# Patient Record
Sex: Male | Born: 1960 | Hispanic: Yes | Marital: Married | Smoking: Current every day smoker
Health system: Southern US, Community
[De-identification: ages and names within clinical notes are randomized; demographics above are authoritative.]

## PROBLEM LIST (undated history)

## (undated) DIAGNOSIS — T7840XA Allergy, unspecified, initial encounter: Secondary | ICD-10-CM

## (undated) DIAGNOSIS — K219 Gastro-esophageal reflux disease without esophagitis: Secondary | ICD-10-CM

## (undated) DIAGNOSIS — E785 Hyperlipidemia, unspecified: Secondary | ICD-10-CM

## (undated) HISTORY — DX: Gastro-esophageal reflux disease without esophagitis: K21.9

## (undated) HISTORY — DX: Allergy, unspecified, initial encounter: T78.40XA

## (undated) HISTORY — PX: POLYPECTOMY: SHX149

## (undated) HISTORY — PX: CHOLECYSTECTOMY: SHX55

## (undated) HISTORY — DX: Hyperlipidemia, unspecified: E78.5

---

## 2006-05-24 ENCOUNTER — Encounter: Admission: RE | Admit: 2006-05-24 | Discharge: 2006-05-24 | Payer: Self-pay | Admitting: Cardiology

## 2007-04-30 IMAGING — CT CT CHEST W/ CM
2 of 4 series · 15 of 36 positions shown, 18 images · IV contrast (75cc omnipaque)
Comparison: none

CLINICAL DATA: Pulmonary nodule noted during cardiac catheterization. 
 CT CHEST WITH CONTRAST:
TECHNIQUE: Multidetector CT imaging of the chest was performed following the standard protocol during bolus administration of intravenous contrast.
 Contrast:  75 cc Omnipaque 300

[Series 2: — · axial · 0.70mm/px · z∈[-249,+1]mm · 12 of 60 slices shown, 15 images]
[im 5/60  mediastinal]
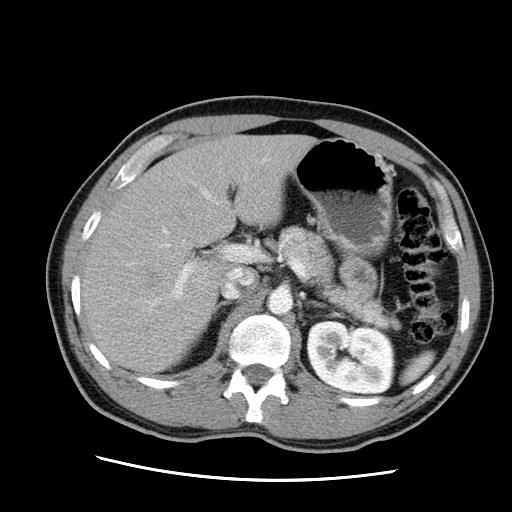
[im 5/60  lung]
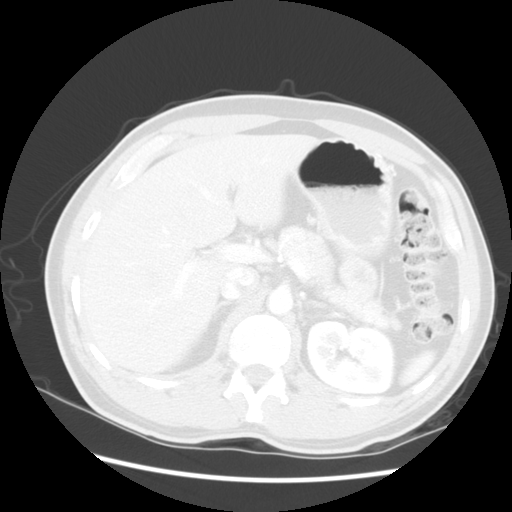
[im 9/60  lung]
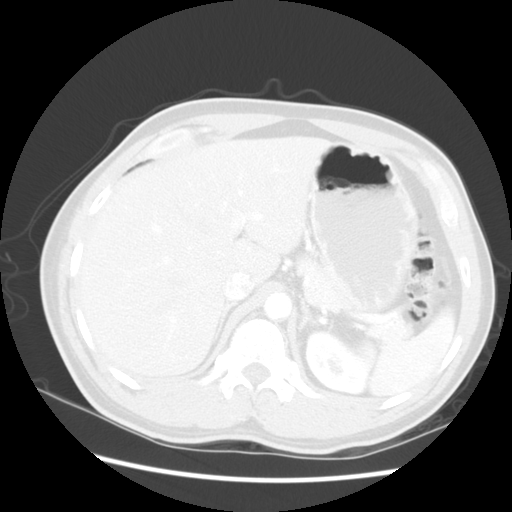
[im 13/60  lung]
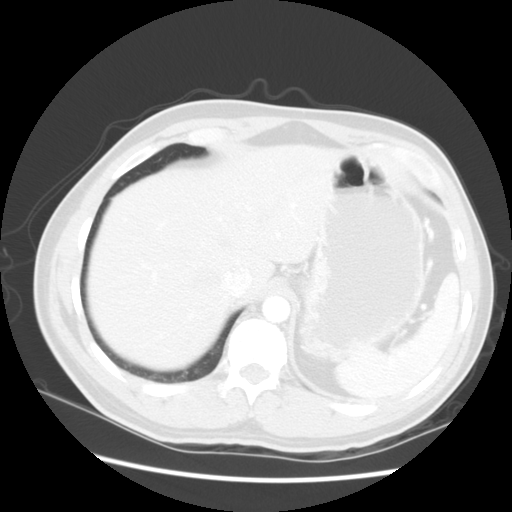
[im 17/60  lung]
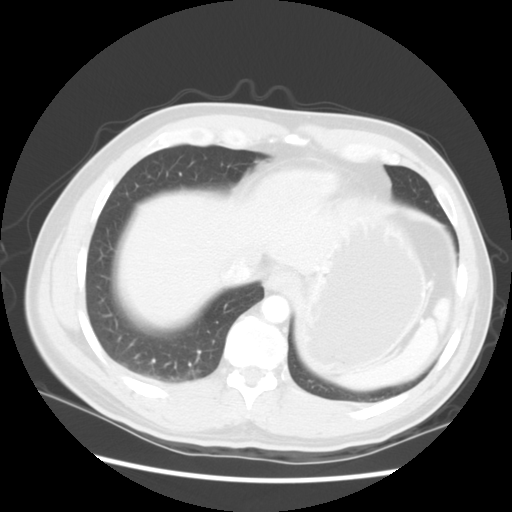
[im 22/60  mediastinal]
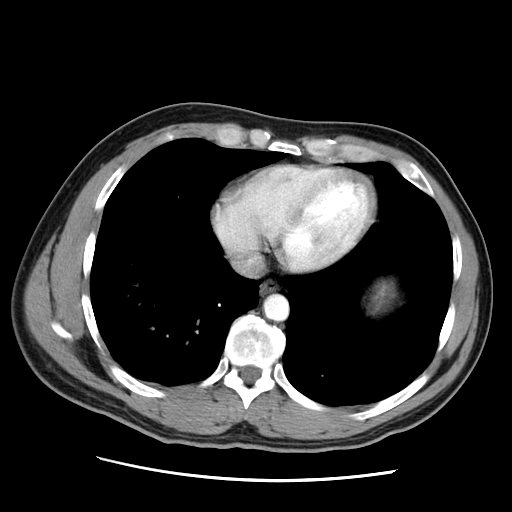
[im 22/60  lung]
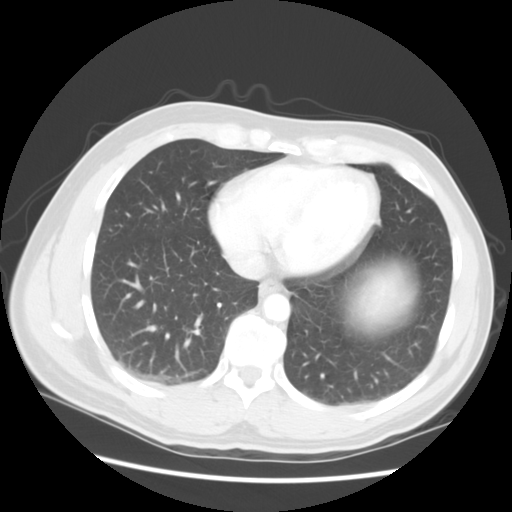
[im 26/60  lung]
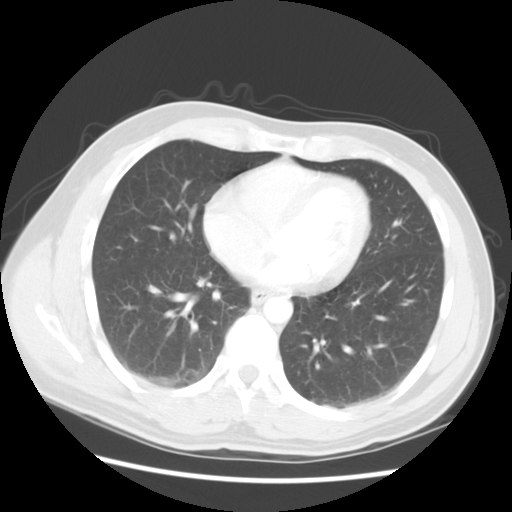
[im 34/60  lung]
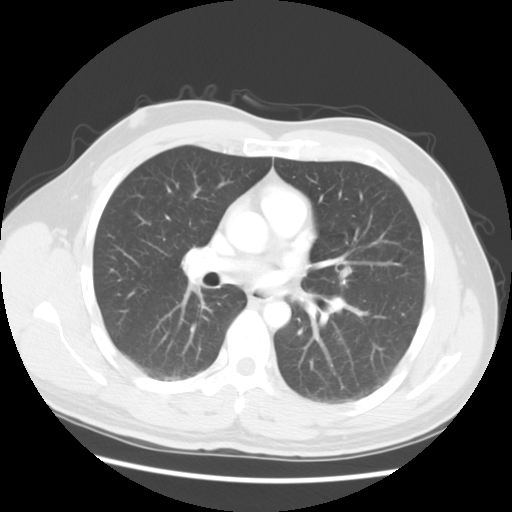
[im 38/60  lung]
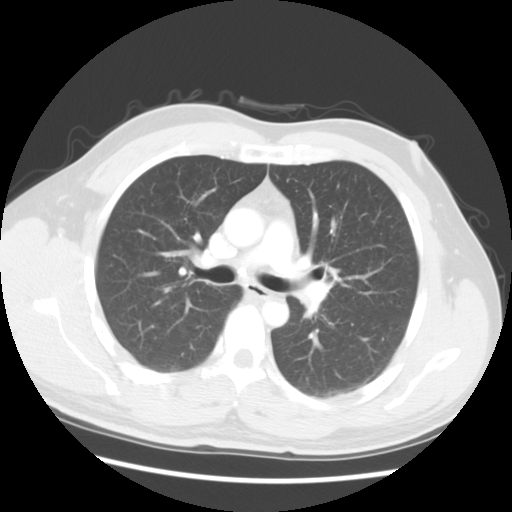
[im 43/60  mediastinal]
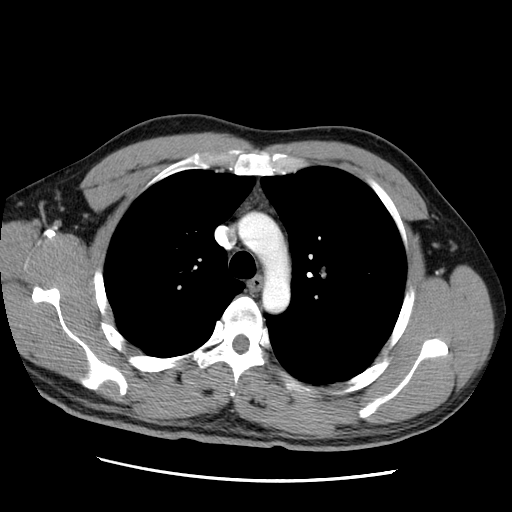
[im 43/60  lung]
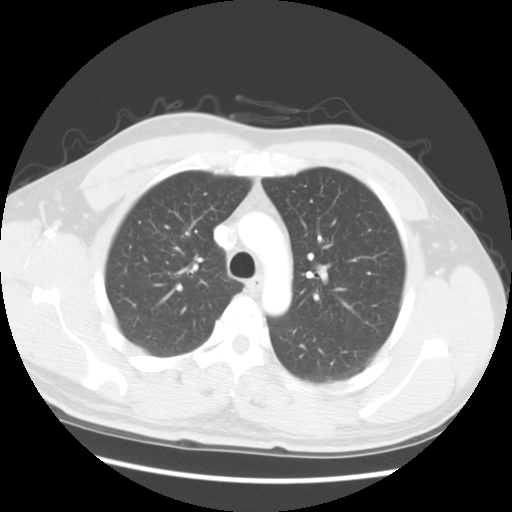
[im 47/60  lung]
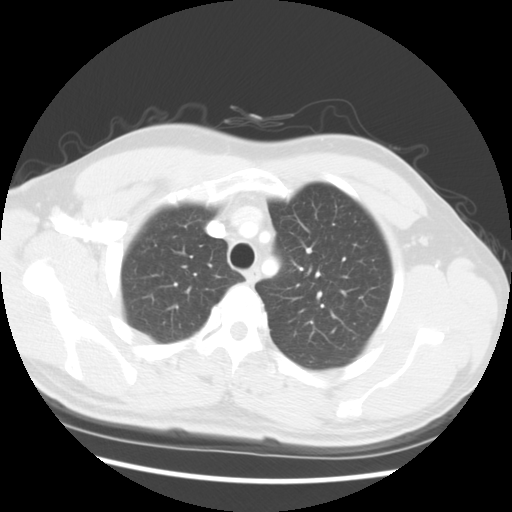
[im 51/60  lung]
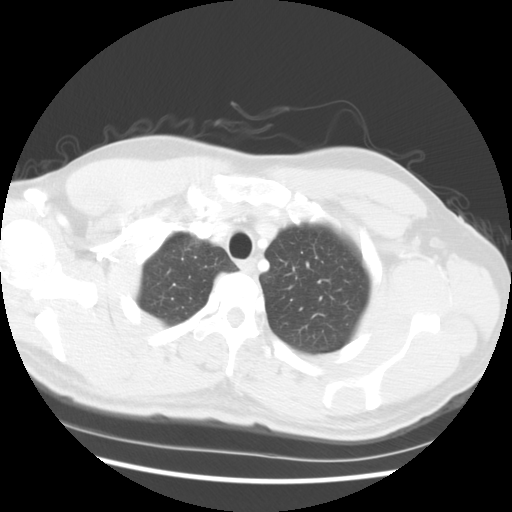
[im 55/60  lung]
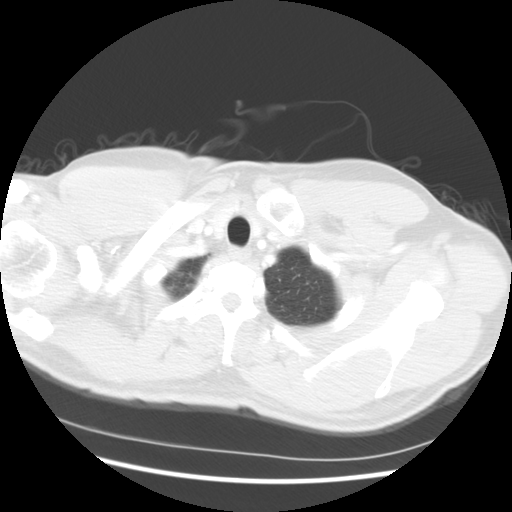

[Series 287: reformatted · coronal · 0.70mm/px · 3 of 88 slices shown]
[im 18/88  lung]
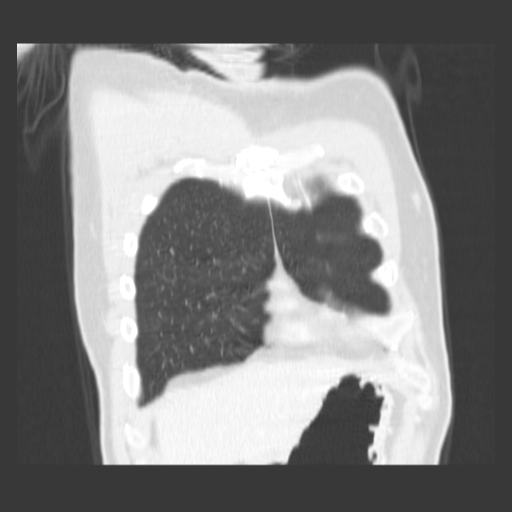
[im 35/88  lung]
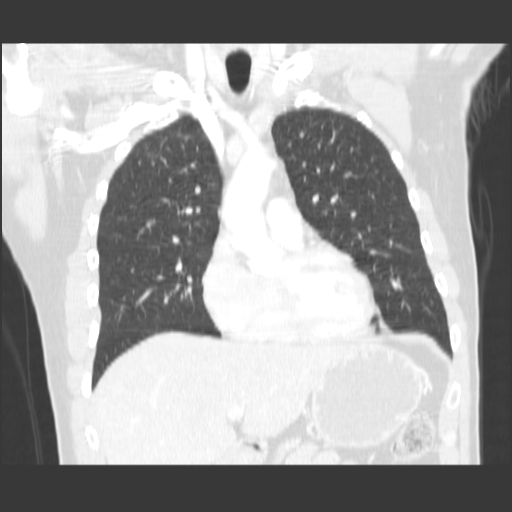
[im 53/88  lung]
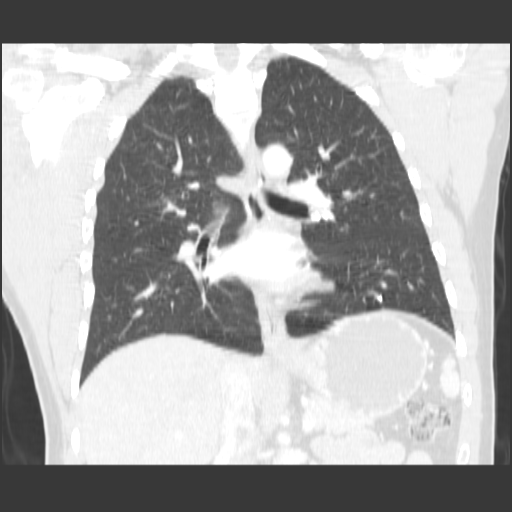

[15 of 36 positions shown; findings below may reference images not displayed]

FINDINGS: No lung nodule is seen.  There are calcified left hilar and mediastinal nodes, and the calcified left hilar nodes may account for the questioned nodule on cardiac catheterization.  These calcified hilar and mediastinal nodes are most likely due to prior granulomatous disease.  No adenopathy is seen.  No bony abnormality is noted.
IMPRESSION: 1.  No suspicious lung nodule is seen.
 2.  There are calcified mediastinal and hilar nodes secondary to prior granulomatous disease.

## 2007-12-05 ENCOUNTER — Ambulatory Visit: Payer: Self-pay | Admitting: Gastroenterology

## 2007-12-05 ENCOUNTER — Encounter: Payer: Self-pay | Admitting: Gastroenterology

## 2007-12-05 DIAGNOSIS — R195 Other fecal abnormalities: Secondary | ICD-10-CM | POA: Insufficient documentation

## 2007-12-26 ENCOUNTER — Ambulatory Visit: Payer: Self-pay | Admitting: Gastroenterology

## 2007-12-26 ENCOUNTER — Encounter: Payer: Self-pay | Admitting: Gastroenterology

## 2011-01-01 ENCOUNTER — Ambulatory Visit (AMBULATORY_SURGERY_CENTER): Payer: 59 | Admitting: *Deleted

## 2011-01-01 VITALS — Ht 66.0 in | Wt 148.0 lb

## 2011-01-01 DIAGNOSIS — Z86010 Personal history of colonic polyps: Secondary | ICD-10-CM

## 2011-01-01 MED ORDER — PEG-KCL-NACL-NASULF-NA ASC-C 100 G PO SOLR: ORAL | Status: DC

## 2011-01-02 ENCOUNTER — Encounter: Payer: Self-pay | Admitting: Gastroenterology

## 2011-01-15 ENCOUNTER — Encounter: Payer: Self-pay | Admitting: Gastroenterology

## 2011-01-15 ENCOUNTER — Ambulatory Visit (AMBULATORY_SURGERY_CENTER): Payer: 59 | Admitting: Gastroenterology

## 2011-01-15 VITALS — BP 118/72 | HR 69 | Temp 97.0°F | Resp 16 | Ht 66.0 in | Wt 148.0 lb

## 2011-01-15 DIAGNOSIS — Z1211 Encounter for screening for malignant neoplasm of colon: Secondary | ICD-10-CM

## 2011-01-15 DIAGNOSIS — Z86010 Personal history of colonic polyps: Secondary | ICD-10-CM

## 2011-01-15 DIAGNOSIS — D126 Benign neoplasm of colon, unspecified: Secondary | ICD-10-CM

## 2011-01-15 HISTORY — PX: COLONOSCOPY: SHX174

## 2011-01-15 MED ORDER — SODIUM CHLORIDE 0.9 % IV SOLN: 500.0000 mL | INTRAVENOUS | Status: DC

## 2011-01-15 NOTE — Patient Instructions (Signed)
PLEASE FOLLOW DISCHARGE INSTRUCTIONS GIVEN TODAY. COLON POLYP X1 WAS REMOVED,READ OVER HANDOUT GIVEN. YOU WILL RECEIVE RESULT LETTER IN YOUR MAIL IN 1-2 WEEKS. REPEAT COLONOSCOPY IN 5 OR 10 YEARS,DEPENDS ON RESULT LETTER YOU RECEIVE.  RESUME REGULAR DAILY MEDICATION.  CALL us WITH ANY QUESTIONS OR CONCERNS.

## 2011-01-16 ENCOUNTER — Telehealth: Payer: Self-pay

## 2011-01-16 NOTE — Telephone Encounter (Signed)

## 2011-01-26 ENCOUNTER — Encounter: Payer: Self-pay | Admitting: Family Medicine

## 2011-01-27 ENCOUNTER — Telehealth: Payer: Self-pay | Admitting: Gastroenterology

## 2011-01-27 NOTE — Telephone Encounter (Signed)
Pt received letter in the mail regarding his path report from his colon and did not quite understand what it meant. Explained the letter to the pt, all questions answered.

## 2011-04-15 NOTE — Progress Notes (Signed)
Addended by: Maple Hudson on: 04/15/2011 11:30 AM   Modules accepted: Level of Service

## 2014-05-18 ENCOUNTER — Encounter: Payer: Self-pay | Admitting: Gastroenterology

## 2015-12-26 ENCOUNTER — Encounter: Payer: Self-pay | Admitting: Gastroenterology

## 2018-09-11 ENCOUNTER — Emergency Department (HOSPITAL_COMMUNITY): Payer: 59

## 2018-09-11 ENCOUNTER — Emergency Department (HOSPITAL_COMMUNITY)
Admission: EM | Admit: 2018-09-11 | Discharge: 2018-09-11 | Disposition: A | Discharge status: Home / Self Care | Payer: 59 | Attending: Emergency Medicine | Admitting: Emergency Medicine

## 2018-09-11 ENCOUNTER — Other Ambulatory Visit: Payer: Self-pay

## 2018-09-11 ENCOUNTER — Encounter (HOSPITAL_COMMUNITY): Payer: Self-pay | Admitting: Emergency Medicine

## 2018-09-11 DIAGNOSIS — N23 Unspecified renal colic: Secondary | ICD-10-CM | POA: Insufficient documentation

## 2018-09-11 DIAGNOSIS — Z79899 Other long term (current) drug therapy: Secondary | ICD-10-CM | POA: Diagnosis not present

## 2018-09-11 DIAGNOSIS — E785 Hyperlipidemia, unspecified: Secondary | ICD-10-CM | POA: Insufficient documentation

## 2018-09-11 DIAGNOSIS — N201 Calculus of ureter: Secondary | ICD-10-CM | POA: Diagnosis not present

## 2018-09-11 DIAGNOSIS — R1011 Right upper quadrant pain: Secondary | ICD-10-CM | POA: Diagnosis present

## 2018-09-11 DIAGNOSIS — N289 Disorder of kidney and ureter, unspecified: Secondary | ICD-10-CM | POA: Insufficient documentation

## 2018-09-11 DIAGNOSIS — F17200 Nicotine dependence, unspecified, uncomplicated: Secondary | ICD-10-CM | POA: Diagnosis not present

## 2018-09-11 LAB — CBC WITH DIFFERENTIAL/PLATELET
Abs Immature Granulocytes: 0.06 10*3/uL (ref 0.00–0.07)
Basophils Absolute: 0 10*3/uL (ref 0.0–0.1)
Basophils Relative: 0 %
EOS ABS: 0 10*3/uL (ref 0.0–0.5)
Eosinophils Relative: 0 %
HCT: 45.2 % (ref 39.0–52.0)
HEMOGLOBIN: 15.2 g/dL (ref 13.0–17.0)
Immature Granulocytes: 0 %
LYMPHS PCT: 13 %
Lymphs Abs: 1.7 10*3/uL (ref 0.7–4.0)
MCH: 30.9 pg (ref 26.0–34.0)
MCHC: 33.6 g/dL (ref 30.0–36.0)
MCV: 91.9 fL (ref 80.0–100.0)
MONO ABS: 1.2 10*3/uL — AB (ref 0.1–1.0)
Monocytes Relative: 9 %
NRBC: 0 % (ref 0.0–0.2)
Neutro Abs: 10.9 10*3/uL — ABNORMAL HIGH (ref 1.7–7.7)
Neutrophils Relative %: 78 %
Platelets: 208 10*3/uL (ref 150–400)
RBC: 4.92 MIL/uL (ref 4.22–5.81)
RDW: 12.2 % (ref 11.5–15.5)
WBC: 13.9 10*3/uL — AB (ref 4.0–10.5)

## 2018-09-11 LAB — COMPREHENSIVE METABOLIC PANEL
ALK PHOS: 52 U/L (ref 38–126)
ALT: 23 U/L (ref 0–44)
AST: 22 U/L (ref 15–41)
Albumin: 3.8 g/dL (ref 3.5–5.0)
Anion gap: 9 (ref 5–15)
BILIRUBIN TOTAL: 0.5 mg/dL (ref 0.3–1.2)
BUN: 19 mg/dL (ref 6–20)
CALCIUM: 9.2 mg/dL (ref 8.9–10.3)
CHLORIDE: 105 mmol/L (ref 98–111)
CO2: 23 mmol/L (ref 22–32)
CREATININE: 1.56 mg/dL — AB (ref 0.61–1.24)
GFR, EST AFRICAN AMERICAN: 57 mL/min — AB (ref >60)
GFR, EST NON AFRICAN AMERICAN: 49 mL/min — AB (ref >60)
Glucose, Bld: 116 mg/dL — ABNORMAL HIGH (ref 70–99)
Potassium: 4.2 mmol/L (ref 3.5–5.1)
Sodium: 137 mmol/L (ref 135–145)
TOTAL PROTEIN: 6.7 g/dL (ref 6.5–8.1)

## 2018-09-11 LAB — URINALYSIS, ROUTINE W REFLEX MICROSCOPIC
BILIRUBIN URINE: NEGATIVE
Glucose, UA: NEGATIVE mg/dL
HGB URINE DIPSTICK: NEGATIVE
KETONES UR: 5 mg/dL — AB
Leukocytes, UA: NEGATIVE
Nitrite: NEGATIVE
PH: 5 (ref 5.0–8.0)
Protein, ur: NEGATIVE mg/dL
SPECIFIC GRAVITY, URINE: 1.025 (ref 1.005–1.030)

## 2018-09-11 MED ORDER — TAMSULOSIN HCL 0.4 MG PO CAPS: ORAL_CAPSULE | ORAL | 0 refills | Status: DC

## 2018-09-11 MED ORDER — SODIUM CHLORIDE 0.9 % IV SOLN
INTRAVENOUS | Status: DC
  Administered 2018-09-11: 13:00:00 via INTRAVENOUS

## 2018-09-11 MED ORDER — HYDROCODONE-ACETAMINOPHEN 5-325 MG PO TABS: 1.0000 | ORAL_TABLET | ORAL | 0 refills | Status: DC | PRN

## 2018-09-11 MED ORDER — KETOROLAC TROMETHAMINE 30 MG/ML IJ SOLN
30.0000 mg | Freq: Once | INTRAMUSCULAR | Status: AC
  Administered 2018-09-11: 30 mg via INTRAVENOUS
  Filled 2018-09-11: qty 1

## 2018-09-11 NOTE — ED Triage Notes (Signed)
Patient sent from PCP. C/o generalized abdominal pain onset of yesterday am. Pain relieves with gas. Last BM yesterday. States he had a urinalysis that showed hematuria.

## 2018-09-11 NOTE — ED Provider Notes (Signed)
[facility] Provider Note   CSN: [id number] Arrival date & time: 09/11/18  1107     History   Chief Complaint Chief Complaint  Patient presents with  . Abdominal Pain    HPI [facility] is a 58 y.o. male.  HPI   He is here for evaluation of abdominal pain, with bloating, following eating.  Symptom onset yesterday and worsening today.  He had a small hard bowel movement last evening but does not think he is constipated.  He saw a provider at an urgent care today and was told he had blood in his urine so he was sent here for evaluation of possible kidney stone.  He denies fever, chills, cough, shortness of breath, chest pain, weakness or dizziness.  There are no other known modifying factors.  Past Medical History:  Diagnosis Date  . Hyperlipidemia     Patient Active Problem List   Diagnosis Date Noted  . BLOOD IN STOOL, OCCULT 12/05/2007    Past Surgical History:  Procedure Laterality Date  . COLONOSCOPY  01/15/2011   polyp  . POLYPECTOMY          Home Medications    Prior to Admission medications   Medication Sig Start Date End Date Taking? Authorizing Provider  HYDROcodone-acetaminophen (NORCO/VICODIN) 5-325 MG tablet Take 1 tablet by mouth every 4 (four) hours as needed for moderate pain. 09/11/18   Daleen Bo, MD  peg 3350 powder (MOVIPREP) 100 G SOLR MOVIPREP as directed Patient not taking: Reported on 09/11/2018 01/01/11   Inda Castle, MD  simvastatin (ZOCOR) 40 MG tablet Take 40 mg by mouth at bedtime.      [provider]  tamsulosin (FLOMAX) 0.4 MG CAPS capsule 1 q HS to aid stone passage 09/11/18   Daleen Bo, MD    Family History No family history on file.  Social History Social History   Tobacco Use  . Smoking status: Current Every Day Smoker    Packs/day: 0.50  Substance Use Topics  . Alcohol use: Yes    Comment: Rare alcohol intake  . Drug use: No     Allergies   Patient  has no known allergies.   Review of Systems Review of Systems  All other systems reviewed and are negative.    Physical Exam Updated Vital Signs BP 109/65   Pulse 65   Temp 97.8 F (36.6 C) (Oral)   Resp 14   Ht 5\' 5"  (1.651 m)   Wt 68.5 kg   SpO2 98%   BMI 25.13 kg/m   Physical Exam Vitals signs and nursing note reviewed.  Constitutional:      Appearance: He is well-developed.  HENT:     Head: Normocephalic and atraumatic.     Right Ear: External ear normal.     Left Ear: External ear normal.  Eyes:     Conjunctiva/sclera: Conjunctivae normal.     Pupils: Pupils are equal, round, and reactive to light.  Neck:     Musculoskeletal: Normal range of motion and neck supple.     Trachea: Phonation normal.  Cardiovascular:     Rate and Rhythm: Normal rate and regular rhythm.     Heart sounds: Normal heart sounds.  Pulmonary:     Effort: Pulmonary effort is normal.     Breath sounds: Normal breath sounds.  Abdominal:     General: Abdomen is flat. Bowel sounds are normal. There is no distension. There are no signs of  injury.     Palpations: Abdomen is soft.     Tenderness: There is abdominal tenderness (Mild) in the right upper quadrant and right lower quadrant. There is no right CVA tenderness or guarding. Negative signs include Murphy's sign.     Hernia: No hernia is present.  Musculoskeletal: Normal range of motion.  Skin:    General: Skin is warm and dry.  Neurological:     Mental Status: He is alert and oriented to person, place, and time.     Cranial Nerves: No cranial nerve deficit.     Sensory: No sensory deficit.     Motor: No abnormal muscle tone.     Coordination: Coordination normal.  Psychiatric:        Behavior: Behavior normal.        Thought Content: Thought content normal.        Judgment: Judgment normal.      ED Treatments / Results  Labs (all labs ordered are listed, but only abnormal results are displayed) Labs Reviewed  COMPREHENSIVE  METABOLIC PANEL - Abnormal; Notable for the following components:      Result Value   Glucose, Bld 116 (*)    Creatinine, Ser 1.56 (*)    GFR calc non Af Amer 49 (*)    GFR calc Af Amer 57 (*)    All other components within normal limits  CBC WITH DIFFERENTIAL/PLATELET - Abnormal; Notable for the following components:   WBC 13.9 (*)    Neutro Abs 10.9 (*)    Monocytes Absolute 1.2 (*)    All other components within normal limits  URINALYSIS, ROUTINE W REFLEX MICROSCOPIC - Abnormal; Notable for the following components:   Ketones, ur 5 (*)    All other components within normal limits    EKG None  Radiology Ct Renal Stone Study  Result Date: 09/11/2018 CLINICAL DATA:  Abdominal pain and distension. Right-sided flank pain. Stone disease suspected. EXAM: CT ABDOMEN AND PELVIS WITHOUT CONTRAST TECHNIQUE: Multidetector CT imaging of the abdomen and pelvis was performed following the standard protocol without IV contrast. COMPARISON:  None. FINDINGS: Lower chest: The lung bases are clear without focal nodule, mass, or airspace disease. Heart size is normal. No significant pleural or pericardial effusion is present. Hepatobiliary: No focal liver abnormality is seen. No gallstones, gallbladder wall thickening, or biliary dilatation. Pancreas: Unremarkable. No pancreatic ductal dilatation or surrounding inflammatory changes. Spleen: Normal in size without focal abnormality. Adrenals/Urinary Tract: The adrenal glands are normal bilaterally. Left kidney is unremarkable. There is no stone or mass lesion. No hydronephrosis is present. Mild right hydronephrosis is present. There is inflammatory change about the right kidney and right ureter to the UVJ where a punctate obstructing stone is present. The urinary bladder is unremarkable. Stomach/Bowel: The stomach and duodenum are normal. Small bowel is unremarkable. Terminal ileum is within normal limits. Appendix is visualized and normal. The ascending and  transverse colon are normal. Descending and sigmoid colon are normal. Vascular/Lymphatic: Atherosclerotic changes are present in the aorta without aneurysm. No significant adenopathy is present. Reproductive: Prostate is unremarkable. Other: No abdominal wall hernia or abnormality. No abdominopelvic ascites. Musculoskeletal: A remote anterior superior endplate fractures present at L2. Vertebral body heights alignment are otherwise maintained. Slight degenerative anterolisthesis is present at L4-5. No focal lytic or blastic lesions are present. Pelvis is intact. Degenerative changes are noted at the SI joints bilaterally. There is fusion of the left SI joint. Hips are located and within normal limits. IMPRESSION: 1. Obstructing  punctate right UVJ stone mild dilation of the ureter and right renal collecting system. 2. Inflammatory changes about the right kidney and right ureter related to the obstruction. 3. No other nonobstructing stones or mass lesion. 4.  Aortic Atherosclerosis (ICD10-I70.0). 5. Degenerative anterolisthesis at L4-5. 6. Remote superior endplate fracture at L2. 7. Degenerative changes of the SI joints bilaterally with fusion on the left. Electronically Signed   By: San Morelle M.D.   On: 09/11/2018 13:11    Procedures Procedures (including critical care time)  Medications Ordered in ED Medications  0.9 %  sodium chloride infusion ( Intravenous New Bag/Given 09/11/18 1250)  ketorolac (TORADOL) 30 MG/ML injection 30 mg (30 mg Intravenous Given 09/11/18 1444)     Initial Impression / Assessment and Plan / ED Course  I have reviewed the triage vital signs and the nursing notes.  Pertinent labs & imaging results that were available during my care of the patient were reviewed by me and considered in my medical decision making (see chart for details).  Clinical Course as of Sep 11 1538  Sun Sep 11, 2018  1359 Normal except glucose high, creatinine high  Comprehensive metabolic  panel(!) [EW]  0092 Normal except white count high  CBC with Differential(!) [EW]  3300 Consistent with obstructing right distal ureter stone, small, images reviewed by me.  CT Renal Stone Study [EW]  1430 Normal  Urinalysis, Routine w reflex microscopic(!) [EW]  1431 At this time, he is now in pain.  Findings discussed and Toradol ordered.   [EW]    Clinical Course User Index [EW] Daleen Bo, MD     Patient Vitals for the past 24 hrs:  BP Temp Temp src Pulse Resp SpO2 Height Weight  09/11/18 1500 109/65 - - 65 14 98 % - -  09/11/18 1430 115/71 - - 63 (!) 27 96 % - -  09/11/18 1400 138/77 - - (!) 58 18 98 % - -  09/11/18 1343 (!) 142/75 - - (!) 57 20 98 % - -  09/11/18 1200 (!) 132/56 - - (!) 52 18 99 % - -  09/11/18 1130 132/72 - - (!) 57 17 97 % - -  09/11/18 1124 - - - - - - 5\' 5"  (1.651 m) 68.5 kg  09/11/18 1121 133/73 97.8 F (36.6 C) Oral 62 16 98 % - -    3:40 PM Reevaluation with update and discussion. After initial assessment and treatment, an updated evaluation reveals pain controlled at this time.  Findings discussed with patient and wife, all questions answered. Daleen Bo   Medical Decision Making: Small right distal ureteral stone with signs for obstruction, of the right urine collecting system.  Doubt UTI, acute renal failure or impending vascular collapse.  Mild creatinine elevation, without comparison labs available.  CRITICAL CARE-no Performed by: Daleen Bo   Nursing Notes Reviewed/ Care Coordinated Applicable Imaging Reviewed Interpretation of Laboratory Data incorporated into ED treatment  The patient appears reasonably screened and/or stabilized for discharge and I doubt any other medical condition or other [facility] requiring further screening, evaluation, or treatment in the ED at this time prior to discharge.  Plan: Home Medications-continue usual medications; Home Treatments-rest, fluids; return here if the recommended treatment, does not improve  the symptoms; Recommended follow up-follow-up PCP regarding creatinine elevation and urology for kidney stone disease.     Final Clinical Impressions(s) / ED Diagnoses   Final diagnoses:  Ureteral colic  Right ureteral stone  Renal insufficiency  ED Discharge Orders         Ordered    tamsulosin (FLOMAX) 0.4 MG CAPS capsule     09/11/18 1538    HYDROcodone-acetaminophen (NORCO/VICODIN) 5-325 MG tablet  Every 4 hours PRN     09/11/18 1538           Daleen Bo, MD 09/11/18 1540

## 2018-09-11 NOTE — ED Notes (Signed)
Patient transported to CT 

## 2018-09-11 NOTE — Discharge Instructions (Addendum)
The pain which you are having is from a kidney stone in the right lower ureter.  This has a high likelihood of passing within the next 2 or 3 days.  Take the prescription medicine as directed to treat your discomfort.  Call the urologist for a follow-up appointment, to see about the kidney stone problem.  Also call your primary care doctor for follow-up appointment to be evaluated for a check on your kidney function.  Your creatinine was mildly elevated today at 1.5.

## 2018-09-23 ENCOUNTER — Ambulatory Visit (AMBULATORY_SURGERY_CENTER): Payer: Self-pay | Admitting: *Deleted

## 2018-09-23 ENCOUNTER — Other Ambulatory Visit: Payer: Self-pay

## 2018-09-23 VITALS — Ht 65.0 in | Wt 154.0 lb

## 2018-09-23 DIAGNOSIS — Z86010 Personal history of colonic polyps: Secondary | ICD-10-CM

## 2018-09-23 MED ORDER — SUPREP BOWEL PREP KIT 17.5-3.13-1.6 GM/177ML PO SOLN: 1.0000 | Freq: Once | ORAL | 0 refills | Status: AC

## 2018-09-23 NOTE — Progress Notes (Signed)
No egg or soy allergy known to patient  No issues with past sedation with any surgeries  or procedures, no intubation problems  No diet pills per patient No home 02 use per patient  No blood thinners per patient  Pt denies issues with constipation  No A fib or A flutter  EMMI video sent to pt's e mail  Patient requested change of date related to care partner issues, changed to 10/18/18 Wrong date placed and printed to start clear liquids(10/17/18)PATIENTS FORM MANUALLY CORRECTED AND PATIENT VERBALIZED UNDERSTANDING

## 2018-10-03 ENCOUNTER — Encounter: Payer: 59 | Admitting: Gastroenterology

## 2018-10-04 ENCOUNTER — Encounter: Payer: Self-pay | Admitting: Gastroenterology

## 2018-10-18 ENCOUNTER — Ambulatory Visit (AMBULATORY_SURGERY_CENTER): Payer: 59 | Admitting: Gastroenterology

## 2018-10-18 ENCOUNTER — Encounter: Payer: Self-pay | Admitting: Gastroenterology

## 2018-10-18 VITALS — BP 109/69 | HR 73 | Temp 96.8°F | Resp 15 | Ht 65.0 in | Wt 164.0 lb

## 2018-10-18 DIAGNOSIS — D128 Benign neoplasm of rectum: Secondary | ICD-10-CM

## 2018-10-18 DIAGNOSIS — D123 Benign neoplasm of transverse colon: Secondary | ICD-10-CM

## 2018-10-18 DIAGNOSIS — Z860100 Personal history of colon polyps, unspecified: Secondary | ICD-10-CM

## 2018-10-18 DIAGNOSIS — D120 Benign neoplasm of cecum: Secondary | ICD-10-CM | POA: Diagnosis not present

## 2018-10-18 DIAGNOSIS — D122 Benign neoplasm of ascending colon: Secondary | ICD-10-CM

## 2018-10-18 DIAGNOSIS — K635 Polyp of colon: Secondary | ICD-10-CM

## 2018-10-18 HISTORY — PX: COLONOSCOPY WITH PROPOFOL: SHX5780

## 2018-10-18 MED ORDER — SODIUM CHLORIDE 0.9 % IV SOLN: 500.0000 mL | Freq: Once | INTRAVENOUS | Status: DC

## 2018-10-18 NOTE — Progress Notes (Signed)
Pt's states no medical or surgical changes since previsit or office visit. 

## 2018-10-18 NOTE — Progress Notes (Signed)
Report given to PACU, vss 

## 2018-10-18 NOTE — Progress Notes (Signed)
Called to room to assist during endoscopic procedure.  Patient ID and intended procedure confirmed with present staff. Received instructions for my participation in the procedure from the performing physician.  

## 2018-10-18 NOTE — Op Note (Addendum)
[facility] Patient Name: [facility] Procedure Date: 10/18/2018 3:29 PM MRN: [id number] Endoscopist: Remo Lipps P. Havery Moros , MD Age: 58 Referring MD:  Date of Birth: Jan 11, 1961 Gender: Male Account #: [id number] Procedure:                Colonoscopy Indications:              Surveillance: Personal history of adenomatous                            polyps on last colonoscopy > 5 years ago (2012) Medicines:                Monitored Anesthesia Care Procedure:                Pre-Anesthesia Assessment:                           - Prior to the procedure, a History and Physical                            was performed, and patient medications and                            allergies were reviewed. The patient's tolerance of                            previous anesthesia was also reviewed. The risks                            and benefits of the procedure and the sedation                            options and risks were discussed with the patient.                            All questions were answered, and informed consent                            was obtained. Prior Anticoagulants: The patient has                            taken no previous anticoagulant or antiplatelet                            agents. ASA Grade Assessment: II - A patient with                            mild systemic disease. After reviewing the risks                            and benefits, the patient was deemed in                            satisfactory condition to undergo the procedure.  After obtaining informed consent, the colonoscope                            was passed under direct vision. Throughout the                            procedure, the patient's blood pressure, pulse, and                            oxygen saturations were monitored continuously. The                            Colonoscope was introduced through the anus and                            advanced to  the the cecum, identified by                            appendiceal orifice and ileocecal valve. The                            colonoscopy was performed without difficulty. The                            patient tolerated the procedure well. The quality                            of the bowel preparation was good. The ileocecal                            valve, appendiceal orifice, and rectum were                            photographed. Scope In: 3:36:19 PM Scope Out: 4:08:20 PM Scope Withdrawal Time: 0 hours 27 minutes 0 seconds  Total Procedure Duration: 0 hours 32 minutes 1 second  Findings:                 The perianal and digital rectal examinations were                            normal.                           A 3 mm polyp was found in the cecum. The polyp was                            sessile. The polyp was removed with a cold snare.                            Resection and retrieval were complete.                           Two sessile polyps were found in the ascending  colon. The polyps were 2 to 3 mm in size. These                            polyps were removed with a cold snare. Resection                            and retrieval were complete.                           Two sessile polyps were found in the transverse                            colon. The polyps were 3 mm in size. These polyps                            were removed with a cold snare. Resection and                            retrieval were complete.                           A 3 mm polyp was found in the splenic flexure. The                            polyp was sessile. The polyp was removed with a                            cold snare. Resection and retrieval were complete.                           A 3 mm polyp was found in the rectum. The polyp was                            sessile. The polyp was removed with a cold snare.                            Resection and retrieval  were complete.                           A few small-mouthed diverticula were found in the                            sigmoid colon.                           The colon was extremely spastic which prolonged                            this procedure. The exam was otherwise without                            abnormality. Complications:            No immediate complications.  Estimated blood loss:                            Minimal. Estimated Blood Loss:     Estimated blood loss was minimal. Impression:               - One 3 mm polyp in the cecum, removed with a cold                            snare. Resected and retrieved.                           - Two 2 to 3 mm polyps in the ascending colon,                            removed with a cold snare. Resected and retrieved.                           - Two 3 mm polyps in the transverse colon, removed                            with a cold snare. Resected and retrieved.                           - One 3 mm polyp at the splenic flexure, removed                            with a cold snare. Resected and retrieved.                           - One 3 mm polyp in the rectum, removed with a cold                            snare. Resected and retrieved.                           - Diverticulosis in the sigmoid colon.                           - Spastic colon.                           - The examination was otherwise normal. Recommendation:           - Patient has a contact number available for                            emergencies. The signs and symptoms of potential                            delayed complications were discussed with the                            patient. Return to normal activities tomorrow.  Written discharge instructions were provided to the                            patient.                           - Resume previous diet.                           - Continue present medications.                            - Await pathology results. Remo Lipps P. [name], MD 10/18/2018 4:14:13 PM This report has been signed electronically.

## 2018-10-18 NOTE — Patient Instructions (Signed)
Please read handouts  Provided. Continue present medications. Await pathology results.       YOU HAD AN ENDOSCOPIC PROCEDURE TODAY AT [facility]:   Refer to the procedure report that was given to you for any specific questions about what was found during the examination.  If the procedure report does not answer your questions, please call your gastroenterologist to clarify.  If you requested that your care partner not be given the details of your procedure findings, then the procedure report has been included in a sealed envelope for you to review at your convenience later.  YOU SHOULD EXPECT: Some feelings of bloating in the abdomen. Passage of more gas than usual.  Walking can help get rid of the air that was put into your GI tract during the procedure and reduce the bloating. If you had a lower endoscopy (such as a colonoscopy or flexible sigmoidoscopy) you may notice spotting of blood in your stool or on the toilet paper. If you underwent a bowel prep for your procedure, you may not have a normal bowel movement for a few days.  Please Note:  You might notice some irritation and congestion in your nose or some drainage.  This is from the oxygen used during your procedure.  There is no need for concern and it should clear up in a day or so.  SYMPTOMS TO REPORT IMMEDIATELY:   Following lower endoscopy (colonoscopy or flexible sigmoidoscopy):  Excessive amounts of blood in the stool  Significant tenderness or worsening of abdominal pains  Swelling of the abdomen that is new, acute  Fever of 100F or higher    For urgent or emergent issues, a gastroenterologist can be reached at any hour by calling [phone].   DIET:  We do recommend a small meal at first, but then you may proceed to your regular diet.  Drink plenty of fluids but you should avoid alcoholic beverages for 24 hours.  ACTIVITY:  You should plan to take it easy for the rest of today and you should NOT  DRIVE or use heavy machinery until tomorrow (because of the sedation medicines used during the test).    FOLLOW UP: Our staff will call the number listed on your records the next business day following your procedure to check on you and address any questions or concerns that you may have regarding the information given to you following your procedure. If we do not reach you, we will leave a message.  However, if you are feeling well and you are not experiencing any problems, there is no need to return our call.  We will assume that you have returned to your regular daily activities without incident.  If any biopsies were taken you will be contacted by phone or by letter within the next 1-3 weeks.  Please call us at [phone] if you have not heard about the biopsies in 3 weeks.    SIGNATURES/CONFIDENTIALITY: You and/or your care partner have signed paperwork which will be entered into your electronic medical record.  These signatures attest to the fact that that the information above on your After Visit Summary has been reviewed and is understood.  Full responsibility of the confidentiality of this discharge information lies with you and/or your care-partner.

## 2018-10-19 ENCOUNTER — Telehealth: Payer: Self-pay | Admitting: *Deleted

## 2018-10-19 NOTE — Telephone Encounter (Signed)
  Follow up Call-  Call back number 10/18/2018  Post procedure Call Back phone  # [phone]  Permission to leave phone message Yes  Some recent data might be hidden     Patient questions:  Do you have a fever, pain , or abdominal swelling? No. Pain Score  0 *  Have you tolerated food without any problems? Yes.    Have you been able to return to your normal activities? Yes.    Do you have any questions about your discharge instructions: Diet   No. Medications  No. Follow up visit  No.  Do you have questions or concerns about your Care? No.  Actions: * If pain score is 4 or above: No action needed, pain <4.

## 2018-10-21 ENCOUNTER — Encounter: Payer: Self-pay | Admitting: Gastroenterology

## 2019-08-18 IMAGING — CT CT RENAL STONE PROTOCOL
2 of 4 series · 15 of 46 positions shown, 17 images · non-contrast
Comparison: None.

CLINICAL DATA: Abdominal pain and distension. Right-sided flank
pain. Stone disease suspected.

EXAM:
CT ABDOMEN AND PELVIS WITHOUT CONTRAST
TECHNIQUE: Multidetector CT imaging of the abdomen and pelvis was performed
following the standard protocol without IV contrast.

[Series 3: renal stone 5.0 · axial · 0.70mm/px · z∈[+815,+1195]mm · 12 of 87 slices shown, 14 images]
[im 7/87  soft-tissue]
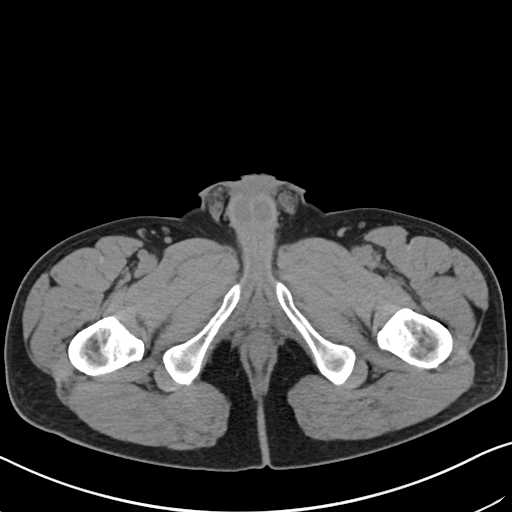
[im 7/87  bone]
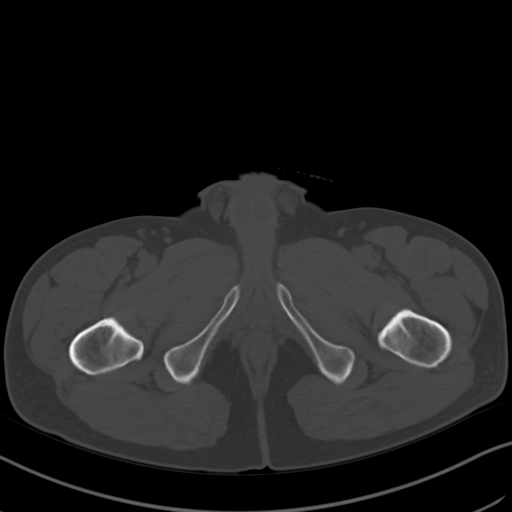
[im 14/87  soft-tissue]
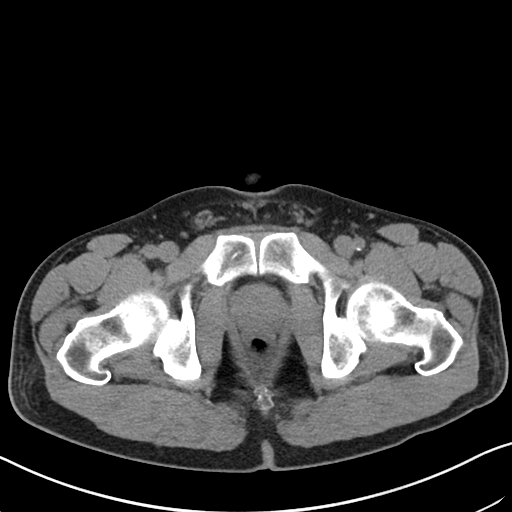
[im 21/87  soft-tissue]
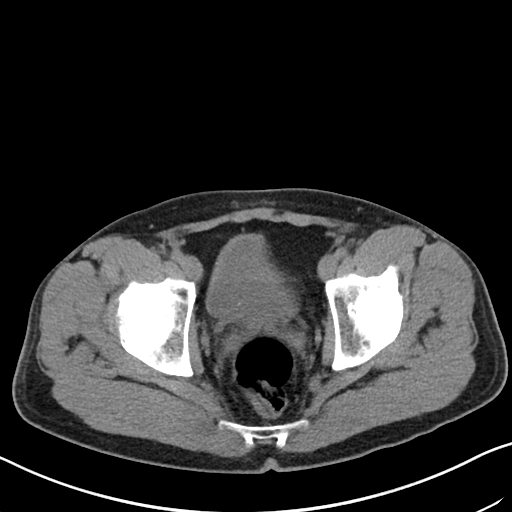
[im 28/87  soft-tissue]
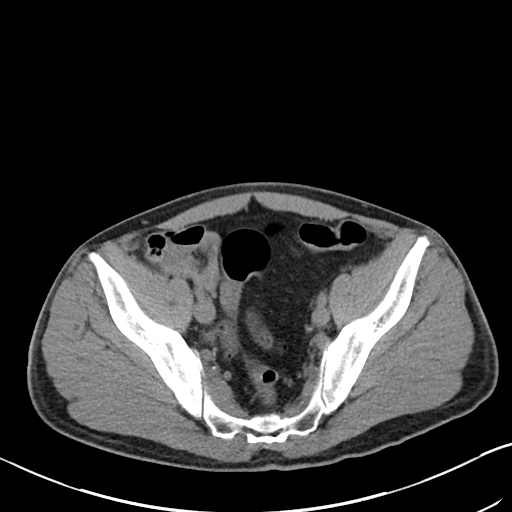
[im 35/87  soft-tissue]
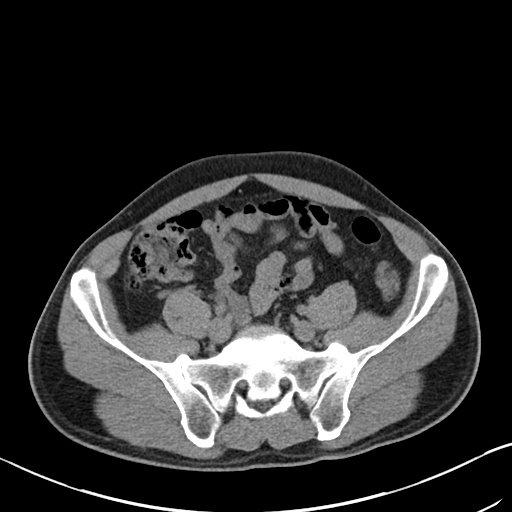
[im 42/87  soft-tissue]
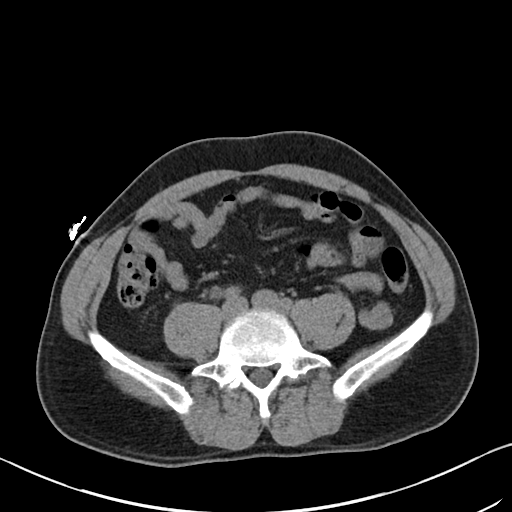
[im 49/87  soft-tissue]
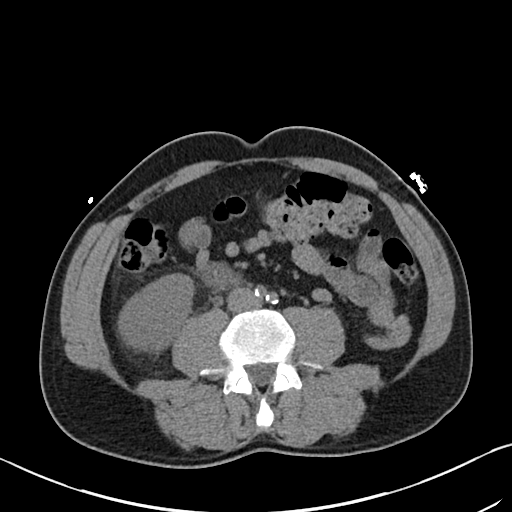
[im 56/87  soft-tissue]
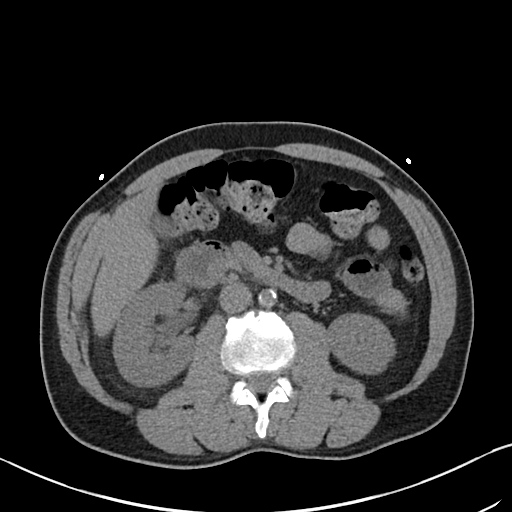
[im 62/87  soft-tissue]
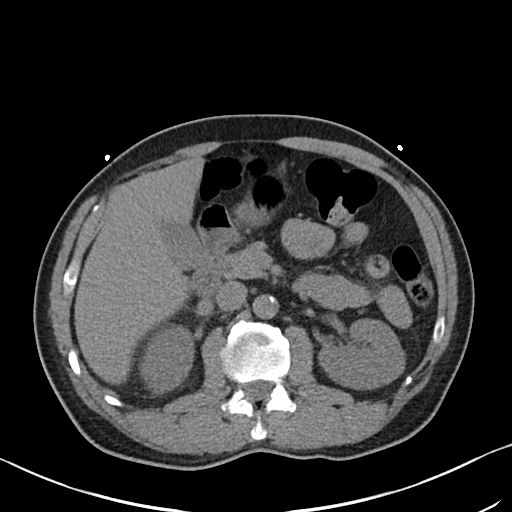
[im 62/87  bone]
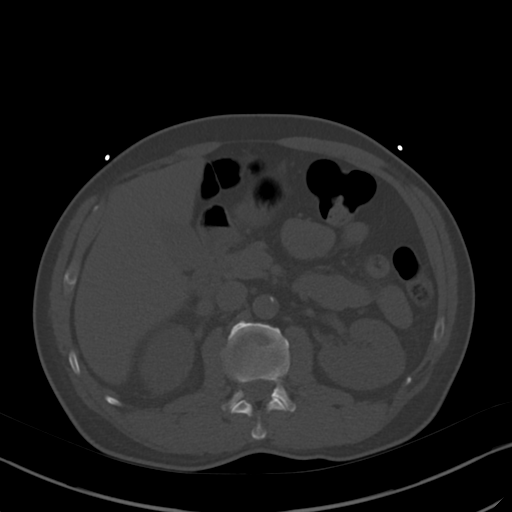
[im 69/87  soft-tissue]
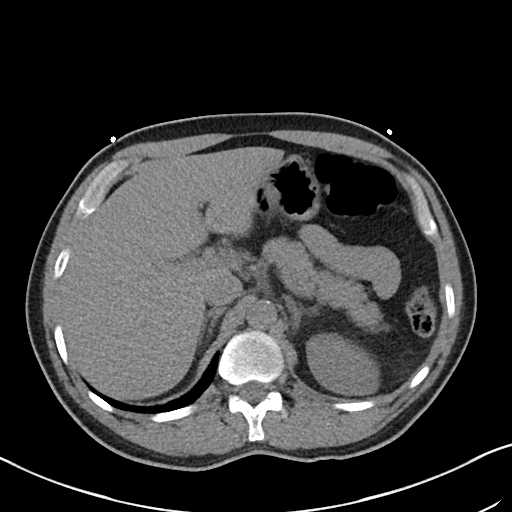
[im 76/87  soft-tissue]
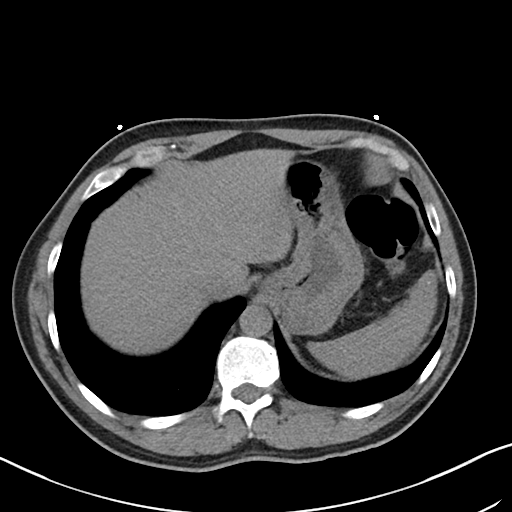
[im 83/87  soft-tissue]
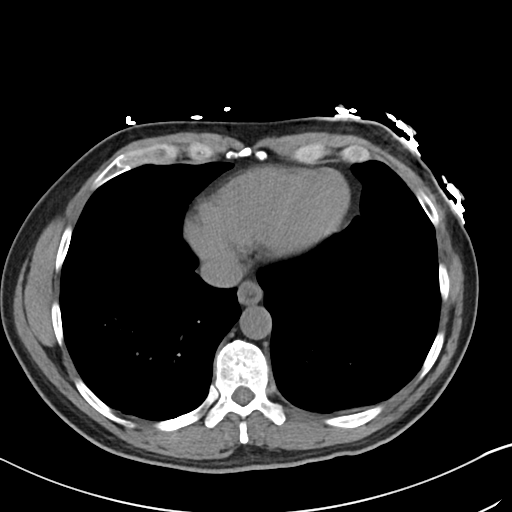

[Series 5: renal stone 3.0 cor · coronal · 0.70mm/px · 3 of 101 slices shown]
[im 34/101  soft-tissue]
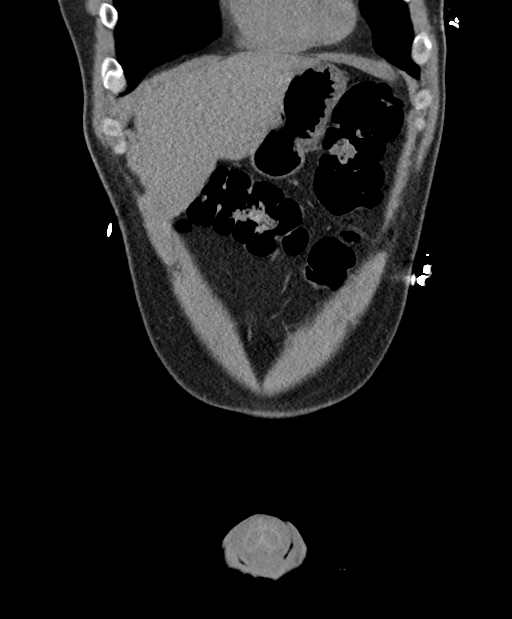
[im 45/101  soft-tissue]
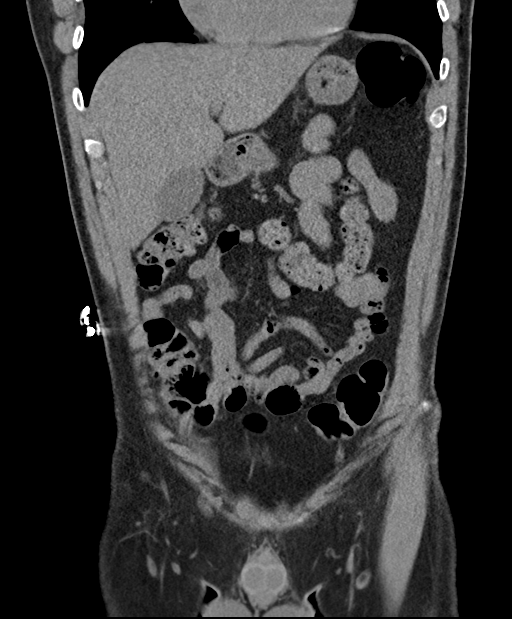
[im 56/101  soft-tissue]
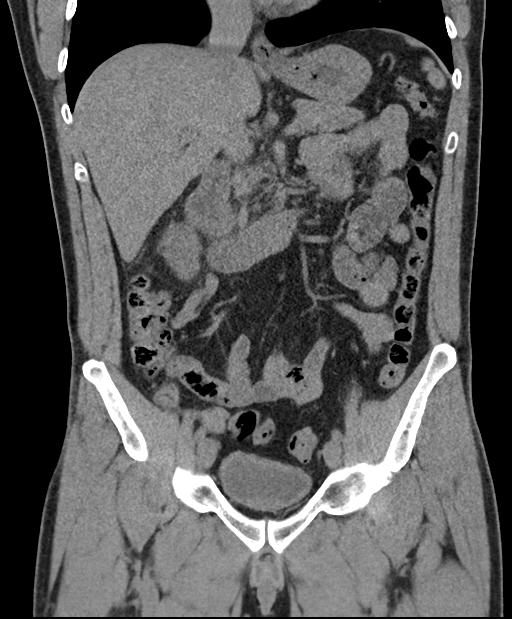

[15 of 46 positions shown; findings below may reference images not displayed]

FINDINGS: Lower chest: The lung bases are clear without focal nodule, mass, or
airspace disease. Heart size is normal. No significant pleural or
pericardial effusion is present.

Hepatobiliary: No focal liver abnormality is seen. No gallstones,
gallbladder wall thickening, or biliary dilatation.

Pancreas: Unremarkable. No pancreatic ductal dilatation or
surrounding inflammatory changes.

Spleen: Normal in size without focal abnormality.

Adrenals/Urinary Tract: The adrenal glands are normal bilaterally.
Left kidney is unremarkable. There is no stone or mass lesion. No
hydronephrosis is present. Mild right hydronephrosis is present.
There is inflammatory change about the right kidney and right ureter
to the UVJ where a punctate obstructing stone is present. The
urinary bladder is unremarkable.

Stomach/Bowel: The stomach and duodenum are normal. Small bowel is
unremarkable. Terminal ileum is within normal limits. Appendix is
visualized and normal. The ascending and transverse colon are
normal. Descending and sigmoid colon are normal.

Vascular/Lymphatic: Atherosclerotic changes are present in the aorta
without aneurysm. No significant adenopathy is present.

Reproductive: Prostate is unremarkable.

Other: No abdominal wall hernia or abnormality. No abdominopelvic
ascites.

Musculoskeletal: A remote anterior superior endplate fractures
present at L2. Vertebral body heights alignment are otherwise
maintained. Slight degenerative anterolisthesis is present at L4-5.
No focal lytic or blastic lesions are present. Pelvis is intact.
Degenerative changes are noted at the SI joints bilaterally. There
is fusion of the left SI joint. Hips are located and within normal
limits.
IMPRESSION: 1. Obstructing punctate right UVJ stone mild dilation of the ureter
and right renal collecting system.
2. Inflammatory changes about the right kidney and right ureter
related to the obstruction.
3. No other nonobstructing stones or mass lesion.
4.  Aortic Atherosclerosis ([id number]).
5. Degenerative anterolisthesis at L4-5.
6. Remote superior endplate fracture at L2.
7. Degenerative changes of the SI joints bilaterally with fusion on
the left.

## 2021-11-10 ENCOUNTER — Encounter: Payer: Self-pay | Admitting: Gastroenterology

## 2021-11-17 ENCOUNTER — Encounter: Payer: Self-pay | Admitting: Gastroenterology

## 2021-12-25 ENCOUNTER — Ambulatory Visit (AMBULATORY_SURGERY_CENTER): Payer: 59 | Admitting: *Deleted

## 2021-12-25 VITALS — Ht 65.0 in | Wt 158.0 lb

## 2021-12-25 DIAGNOSIS — Z86010 Personal history of colonic polyps: Secondary | ICD-10-CM

## 2021-12-25 MED ORDER — NA SULFATE-K SULFATE-MG SULF 17.5-3.13-1.6 GM/177ML PO SOLN: 1.0000 | ORAL | 0 refills | Status: DC

## 2021-12-25 NOTE — Progress Notes (Signed)
Patient's pre-visit was done today over the phone with the patient. Name,DOB and address verified. Patient denies any allergies to Eggs and Soy. Patient denies any problems with anesthesia/sedation. Patient is not taking any diet pills or blood thinners. No home Oxygen. Insurance confirmed with patient. ? ?Prep instructions mailed to pt-pt is aware. Patient understands to call us back with any questions or concerns. Patient is aware of our care-partner policy.  ? ?The patient is COVID-19 vaccinated.   ?

## 2021-12-31 ENCOUNTER — Encounter: Payer: Self-pay | Admitting: Gastroenterology

## 2022-01-04 ENCOUNTER — Encounter: Payer: Self-pay | Admitting: Certified Registered Nurse Anesthetist

## 2022-01-07 ENCOUNTER — Encounter: Payer: Self-pay | Admitting: Certified Registered Nurse Anesthetist

## 2022-01-08 ENCOUNTER — Encounter: Payer: Self-pay | Admitting: Gastroenterology

## 2022-01-08 ENCOUNTER — Ambulatory Visit (AMBULATORY_SURGERY_CENTER): Payer: 59 | Admitting: Gastroenterology

## 2022-01-08 VITALS — BP 98/65 | HR 66 | Temp 97.5°F | Resp 14 | Ht 65.0 in | Wt 158.0 lb

## 2022-01-08 DIAGNOSIS — D120 Benign neoplasm of cecum: Secondary | ICD-10-CM | POA: Diagnosis not present

## 2022-01-08 DIAGNOSIS — D125 Benign neoplasm of sigmoid colon: Secondary | ICD-10-CM

## 2022-01-08 DIAGNOSIS — Z86010 Personal history of colonic polyps: Secondary | ICD-10-CM

## 2022-01-08 DIAGNOSIS — D123 Benign neoplasm of transverse colon: Secondary | ICD-10-CM | POA: Diagnosis not present

## 2022-01-08 MED ORDER — SODIUM CHLORIDE 0.9 % IV SOLN: 500.0000 mL | Freq: Once | INTRAVENOUS | Status: DC

## 2022-01-08 NOTE — Patient Instructions (Signed)
Handouts on polyps and diverticulosis given to patient. ?Await pathology results. ?Resume previous diet and continue present medications. ?Repeat colonoscopy for surveillance will be determined based off of pathology results. ? ? ?YOU HAD AN ENDOSCOPIC PROCEDURE TODAY AT [facility]:   Refer to the procedure report that was given to you for any specific questions about what was found during the examination.  If the procedure report does not answer your questions, please call your gastroenterologist to clarify.  If you requested that your care partner not be given the details of your procedure findings, then the procedure report has been included in a sealed envelope for you to review at your convenience later. ? ?YOU SHOULD EXPECT: Some feelings of bloating in the abdomen. Passage of more gas than usual.  Walking can help get rid of the air that was put into your GI tract during the procedure and reduce the bloating. If you had a lower endoscopy (such as a colonoscopy or flexible sigmoidoscopy) you may notice spotting of blood in your stool or on the toilet paper. If you underwent a bowel prep for your procedure, you may not have a normal bowel movement for a few days. ? ?Please Note:  You might notice some irritation and congestion in your nose or some drainage.  This is from the oxygen used during your procedure.  There is no need for concern and it should clear up in a day or so. ? ?SYMPTOMS TO REPORT IMMEDIATELY: ? ?Following lower endoscopy (colonoscopy or flexible sigmoidoscopy): ? Excessive amounts of blood in the stool ? Significant tenderness or worsening of abdominal pains ? Swelling of the abdomen that is new, acute ? Fever of 100?F or higher ? ?For urgent or emergent issues, a gastroenterologist can be reached at any hour by calling [phone]. ?Do not use MyChart messaging for urgent concerns.  ? ? ?DIET:  We do recommend a small meal at first, but then you may proceed to your  regular diet.  Drink plenty of fluids but you should avoid alcoholic beverages for 24 hours. ? ?ACTIVITY:  You should plan to take it easy for the rest of today and you should NOT DRIVE or use heavy machinery until tomorrow (because of the sedation medicines used during the test).   ? ?FOLLOW UP: ?Our staff will call the number listed on your records 48-72 hours following your procedure to check on you and address any questions or concerns that you may have regarding the information given to you following your procedure. If we do not reach you, we will leave a message.  We will attempt to reach you two times.  During this call, we will ask if you have developed any symptoms of COVID 19. If you develop any symptoms (ie: fever, flu-like symptoms, shortness of breath, cough etc.) before then, please call [phone].  If you test positive for Covid 19 in the 2 weeks post procedure, please call and report this information to [place].   ? ?If any biopsies were taken you will be contacted by phone or by letter within the next 1-3 weeks.  Please call us at [phone] if you have not heard about the biopsies in 3 weeks.  ? ? ?SIGNATURES/CONFIDENTIALITY: ?You and/or your care partner have signed paperwork which will be entered into your electronic medical record.  These signatures attest to the fact that that the information above on your After Visit Summary has been reviewed and is understood.  Full responsibility of  the confidentiality of this discharge information lies with you and/or your care-partner.  ?

## 2022-01-08 NOTE — Op Note (Signed)
[facility] ?Patient Name: Mason Hernandez ?Procedure Date: 01/08/2022 10:47 AM ?MRN: [id number] ?Endoscopist: Carlota Raspberry. Havery Moros , MD ?Age: 61 ?Referring MD:  ?Date of Birth: [birth year] ?Gender: Male ?Account #: [id number] ?Procedure:                Colonoscopy ?Indications:              High risk colon cancer surveillance: Personal  ?                          history of colonic polyps - 7 polyps removed  ?                          10/2018. Most adenomas. ?Medicines:                Monitored Anesthesia Care ?Procedure:                Pre-Anesthesia Assessment: ?                          - Prior to the procedure, a History and Physical  ?                          was performed, and patient medications and  ?                          allergies were reviewed. The patient's tolerance of  ?                          previous anesthesia was also reviewed. The risks  ?                          and benefits of the procedure and the sedation  ?                          options and risks were discussed with the patient.  ?                          All questions were answered, and informed consent  ?                          was obtained. Prior Anticoagulants: The patient has  ?                          taken no previous anticoagulant or antiplatelet  ?                          agents. ASA Grade Assessment: II - A patient with  ?                          mild systemic disease. After reviewing the risks  ?                          and benefits, the patient was deemed in  ?  satisfactory condition to undergo the procedure. ?                          After obtaining informed consent, the colonoscope  ?                          was passed under direct vision. Throughout the  ?                          procedure, the patient's blood pressure, pulse, and  ?                          oxygen saturations were monitored continuously. The  ?                          CF [id number] #[id number] was introduced  through the anus  ?                          and advanced to the the cecum, identified by  ?                          appendiceal orifice and ileocecal valve. The  ?                          colonoscopy was performed without difficulty. The  ?                          patient tolerated the procedure well. The quality  ?                          of the bowel preparation was good. The ileocecal  ?                          valve, appendiceal orifice, and rectum were  ?                          photographed. ?Scope In: 10:59:01 AM ?Scope Out: 11:15:26 AM ?Scope Withdrawal Time: 0 hours 14 minutes 37 seconds  ?Total Procedure Duration: 0 hours 16 minutes 25 seconds  ?Findings:                 The perianal and digital rectal examinations were  ?                          normal. ?                          Two sessile polyps were found in the cecum. The  ?                          polyps were 2 mm in size. These polyps were removed  ?                          with a cold snare. Resection and retrieval were  ?  complete. ?                          A 4 mm polyp was found in the transverse colon. The  ?                          polyp was sessile. The polyp was removed with a  ?                          cold snare. Resection and retrieval were complete. ?                          A 3 mm polyp was found in the sigmoid colon. The  ?                          polyp was sessile. The polyp was removed with a  ?                          cold snare. Resection and retrieval were complete. ?                          A few small-mouthed diverticula were found in the  ?                          sigmoid colon. ?                          Internal hemorrhoids were found during retroflexion. ?                          The exam was otherwise without abnormality. ?Complications:            No immediate complications. Estimated blood loss:  ?                          Minimal. ?Estimated Blood Loss:     Estimated blood loss  was minimal. ?Impression:               - Two 2 mm polyps in the cecum, removed with a cold  ?                          snare. Resected and retrieved. ?                          - One 4 mm polyp in the transverse colon, removed  ?                          with a cold snare. Resected and retrieved. ?                          - One 3 mm polyp in the sigmoid colon, removed with  ?                          a cold snare. Resected and retrieved. ?                          -  Diverticulosis in the sigmoid colon. ?                          - Internal hemorrhoids. ?                          - The examination was otherwise normal. ?Recommendation:           - Patient has a contact number available for  ?                          emergencies. The signs and symptoms of potential  ?                          delayed complications were discussed with the  ?                          patient. Return to normal activities tomorrow.  ?                          Written discharge instructions were provided to the  ?                          patient. ?                          - Resume previous diet. ?                          - Continue present medications. ?                          - Await pathology results. ?Carlota Raspberry. Grayling Schranz, MD ?01/08/2022 11:20:28 AM ?This report has been signed electronically. ?

## 2022-01-08 NOTE — Progress Notes (Signed)
Pt's states no medical or surgical changes since previsit or office visit. 

## 2022-01-08 NOTE — Progress Notes (Signed)
Called to room to assist during endoscopic procedure.  Patient ID and intended procedure confirmed with present staff. Received instructions for my participation in the procedure from the performing physician.  

## 2022-01-08 NOTE — Progress Notes (Signed)
[facility] History and Physical ? ? ?Primary Care Physician:  Patrecia Pour, Christean Grief, MD ? ? ?Reason for Procedure:   History of colon polyps ? ?Plan:    colonoscopy ? ? ? ? ?HPI: Mason Hernandez is a 61 y.o. male  here for colonoscopy surveillance - 7 adenomas removed 10/2018. Patient denies any bowel symptoms at this time. No family history of colon cancer known. Otherwise feels well without any cardiopulmonary symptoms.  ? ? ?Past Medical History:  ?Diagnosis Date  ? Allergy   ? GERD (gastroesophageal reflux disease)   ? Hyperlipidemia   ? ? ?Past Surgical History:  ?Procedure Laterality Date  ? CHOLECYSTECTOMY    ? COLONOSCOPY  01/15/2011  ? polyp  ? COLONOSCOPY WITH PROPOFOL  10/18/2018  ? Dr.Lillith Mcneff  ? POLYPECTOMY    ? ? ?Prior to Admission medications   ?Medication Sig Start Date End Date Taking? Authorizing Provider  ?Na Sulfate-K Sulfate-Mg Sulf 17.5-3.13-1.6 GM/177ML SOLN Take 1 kit by mouth as directed. May use generic Suprep 12/25/21  Yes Jermane Brayboy, Carlota Raspberry, MD  ?omeprazole (PRILOSEC) 20 MG capsule Take by mouth.   Yes [provider]  ? ? ?Current Outpatient Medications  ?Medication Sig Dispense Refill  ? Na Sulfate-K Sulfate-Mg Sulf 17.5-3.13-1.6 GM/177ML SOLN Take 1 kit by mouth as directed. May use generic Suprep 354 mL 0  ? omeprazole (PRILOSEC) 20 MG capsule Take by mouth.    ? ?Current Facility-Administered Medications  ?Medication Dose Route Frequency Provider Last Rate Last Admin  ? 0.9 %  sodium chloride infusion  500 mL Intravenous Once Purnell Daigle, Carlota Raspberry, MD      ? ? ?Allergies as of 01/08/2022  ? (No Known Allergies)  ? ? ?Family History  ?Problem Relation Age of Onset  ? Ovarian cancer Mother   ? Colon cancer Neg Hx   ? Colon polyps Neg Hx   ? Esophageal cancer Neg Hx   ? Rectal cancer Neg Hx   ? Stomach cancer Neg Hx   ? ? ?Social History  ? ?Socioeconomic History  ? Marital status: Married  ?  Spouse name: Not on file  ? Number of children: Not on file  ? Years  of education: Not on file  ? Highest education level: Not on file  ?Occupational History  ? Not on file  ?Tobacco Use  ? Smoking status: Every Day  ?  Packs/day: 0.25  ?  Types: Cigarettes  ? Smokeless tobacco: Never  ?Vaping Use  ? Vaping Use: Never used  ?Substance and Sexual Activity  ? Alcohol use: Not Currently  ?  Comment: Rare alcohol intake  ? Drug use: No  ? Sexual activity: Not on file  ?Other Topics Concern  ? Not on file  ?Social History Narrative  ? Not on file  ? ?Social Determinants of Health  ? ?Financial Resource Strain: Not on file  ?Food Insecurity: Not on file  ?Transportation Needs: Not on file  ?Physical Activity: Not on file  ?Stress: Not on file  ?Social Connections: Not on file  ?Intimate Partner Violence: Not on file  ? ? ?Review of Systems: ?All other review of systems negative except as mentioned in the HPI. ? ?Physical Exam: ?Vital signs ?BP 131/63   Pulse 77   Temp (!) 97.5 ?F (36.4 ?C)   Ht '5\' 5"'  (1.651 m)   Wt 158 lb (71.7 kg)   SpO2 97%   BMI 26.29 kg/m?  ? ?General:   Alert,  Well-developed, pleasant and  cooperative in NAD ?Lungs:  Clear throughout to auscultation.   ?Heart:  Regular rate and rhythm ?Abdomen:  Soft, nontender and nondistended.   ?Neuro/Psych:  Alert and cooperative. Normal mood and affect. A and O x 3 ? ?Jolly Mango, MD ?[facility] ? ? ?

## 2022-01-08 NOTE — Progress Notes (Signed)
Report given to PACU, vss 

## 2022-01-12 ENCOUNTER — Telehealth: Payer: Self-pay | Admitting: *Deleted

## 2022-01-12 NOTE — Telephone Encounter (Signed)
?  Follow up Call- ? ? ?  01/08/2022  ? 10:28 AM  ?Call back number  ?Post procedure Call Back phone  # [phone]  ?Permission to leave phone message Yes  ?  ? ?Patient questions: ? ?Do you have a fever, pain , or abdominal swelling? No. ?Pain Score  0 * ? ?Have you tolerated food without any problems? Yes.   ? ?Have you been able to return to your normal activities? Yes.   ? ?Do you have any questions about your discharge instructions: ?Diet   No. ?Medications  No. ?Follow up visit  No. ? ?Do you have questions or concerns about your Care? No. ? ?Actions: ?* If pain score is 4 or above: ?No action needed, pain <4. ? ? ?

## 2022-01-12 NOTE — Telephone Encounter (Signed)
First attempt, left VM.  

## 2022-01-13 ENCOUNTER — Encounter: Payer: Self-pay | Admitting: Gastroenterology
